# Patient Record
Sex: Male | Born: 1985 | Hispanic: No | Marital: Single | Smoking: Never smoker
Health system: Southern US, Community
[De-identification: ages and names within clinical notes are randomized; demographics above are authoritative.]

---

## 2015-10-10 ENCOUNTER — Encounter (HOSPITAL_BASED_OUTPATIENT_CLINIC_OR_DEPARTMENT_OTHER): Payer: Self-pay | Admitting: Emergency Medicine

## 2015-10-10 ENCOUNTER — Emergency Department (HOSPITAL_BASED_OUTPATIENT_CLINIC_OR_DEPARTMENT_OTHER)
Admission: EM | Admit: 2015-10-10 | Discharge: 2015-10-10 | Disposition: A | Discharge status: Home / Self Care | Payer: Self-pay | Attending: Emergency Medicine | Admitting: Emergency Medicine

## 2015-10-10 ENCOUNTER — Emergency Department (HOSPITAL_BASED_OUTPATIENT_CLINIC_OR_DEPARTMENT_OTHER): Payer: Self-pay

## 2015-10-10 ENCOUNTER — Emergency Department: Payer: Self-pay

## 2015-10-10 DIAGNOSIS — Y998 Other external cause status: Secondary | ICD-10-CM | POA: Insufficient documentation

## 2015-10-10 DIAGNOSIS — M25561 Pain in right knee: Secondary | ICD-10-CM

## 2015-10-10 DIAGNOSIS — S86911A Strain of unspecified muscle(s) and tendon(s) at lower leg level, right leg, initial encounter: Secondary | ICD-10-CM | POA: Insufficient documentation

## 2015-10-10 DIAGNOSIS — X58XXXA Exposure to other specified factors, initial encounter: Secondary | ICD-10-CM | POA: Insufficient documentation

## 2015-10-10 DIAGNOSIS — Y9389 Activity, other specified: Secondary | ICD-10-CM | POA: Insufficient documentation

## 2015-10-10 DIAGNOSIS — Y9289 Other specified places as the place of occurrence of the external cause: Secondary | ICD-10-CM | POA: Insufficient documentation

## 2015-10-10 MED ORDER — IBUPROFEN 800 MG PO TABS
800.0000 mg | ORAL_TABLET | Freq: Once | ORAL | Status: AC
  Administered 2015-10-10: 800 mg via ORAL
  Filled 2015-10-10: qty 1

## 2015-10-10 NOTE — ED Notes (Signed)
C/o right knee pain with bending. No injury. Onset Sunday.

## 2015-10-10 NOTE — ED Provider Notes (Addendum)
CSN: [id number]     Arrival date & time 10/10/15  0732 History   First MD Initiated Contact with Patient 10/10/15 0744     No chief complaint on file.    (Consider location/radiation/quality/duration/timing/severity/associated sxs/prior Treatment) HPI 30 year old male who presents with right knee pain. No significant PMH. Moving furniture 4-5 days ago, and the next day began to noticed right knee pain. Taking ibuprofen for pain control. No fall or trauma. No significant swelling. Has been able to ambulate. Localizes pain to the superior aspect of the knee. No other injuries.   History reviewed. No pertinent past medical history. History reviewed. No pertinent past surgical history. History reviewed. No pertinent family history. Social History  Substance Use Topics  . Smoking status: Never Smoker   . Smokeless tobacco: None  . Alcohol Use: None    Review of Systems  Constitutional: Negative for fever.  Musculoskeletal: Negative for joint swelling.  Skin: Negative for wound.  Hematological: Does not bruise/bleed easily.  All other systems reviewed and are negative.     Allergies  Review of patient's allergies indicates no known allergies.  Home Medications   Prior to Admission medications   Not on File   BP 139/100 mmHg  Pulse 112  Temp(Src) 99.1 F (37.3 C) (Oral)  Resp 26  Ht 6' 2.5" (1.892 m)  Wt 398 lb (180.532 kg)  BMI 50.43 kg/m2  SpO2 99% Physical Exam Physical Exam  Nursing note and vitals reviewed. Constitutional: Well developed, well nourished, non-toxic, and in no acute distress Head: Normocephalic and atraumatic.  Mouth/Throat: Oropharynx is clear and moist.  Neck: Normal range of motion. Neck supple.  Cardiovascular: Normal rate and regular rhythm.  +2 DP pulse bilaterally Pulmonary/Chest: Effort normal Abdominal: Soft. Non-tender  Musculoskeletal: focused exam of right knee, tender to palpation over superior aspect of the left knee, no swelling or  deformities, full flexion and pain with extension Neurological: Alert, no facial droop, fluent speech, in tact sensation to light touch in lower extremities, full strength ankle plantar/dorsiflexion Skin: Skin is warm and dry.  Psychiatric: Cooperative  ED Course  Procedures (including critical care time) Labs Review Labs Reviewed - No data to display  Imaging Review Dg Knee Complete 4 Views Right  10/10/2015  CLINICAL DATA:  Anterior right knee pain beginning 3 days ago. No known injury. EXAM: RIGHT KNEE - COMPLETE 4+ VIEW COMPARISON:  None. FINDINGS: No acute fracture, dislocation, or knee joint effusion is identified. There is minimal articular surface patellar spurring, and a small superior patellar enthesophyte is also noted. 9 mm well-circumscribed oval density in the proximal shaft of the tibia is suggestive of a benign bone island. Medial and lateral compartment joint space widths are preserved. No focal soft tissue abnormality is identified. IMPRESSION: No acute osseous abnormality identified. Superior patellar enthesophyte. Electronically Signed   By: Sebastian Ache M.D.   On: 10/10/2015 08:23   I have personally reviewed and evaluated these images and lab results as part of my medical decision-making.   EKG Interpretation None      MDM   Final diagnoses:  Knee strain, right, initial encounter    30 year old male who presents with right knee pain. Able to ambulate and neurovascularly in tact distally. Suspect strain of knee with heavy lifting. Discussed supportive care instructions. Given sports med follow-up. X-ray without fracture. Sports med referral given. Strict return and follow-up instructions reviewed. He expressed understanding of all discharge instructions and felt comfortable with the plan  of care.     Lavera Guiseana Duo Liu, MD 10/10/15 [id number]  Lavera Guiseana Duo Liu, MD 10/10/15 [phone]

## 2015-10-10 NOTE — Discharge Instructions (Signed)
No fracture seen on X-ray. Keep knee elevated at rest. Ice knee at rest. Take motrin and tylenol as needed for pain control. Return for worsening symptoms, including inability to walk, new numbness/weakness, or any other symptoms concerning to you.  Knee Pain Knee pain is a common problem. It can have many causes. The pain often goes away by following your doctor's home care instructions. Treatment for ongoing pain will depend on the cause of your pain. If your knee pain continues, more tests may be needed to diagnose your condition. Tests may include X-rays or other imaging studies of your knee. HOME CARE  Take medicines only as told by your doctor.  Rest your knee and keep it raised (elevated) while you are resting.  Do not do things that cause pain or make your pain worse.  Avoid activities where both feet leave the ground at the same time, such as running, jumping rope, or doing jumping jacks.  Apply ice to the knee area:  Put ice in a plastic bag.  Place a towel between your skin and the bag.  Leave the ice on for 20 minutes, 2-3 times a day.  Ask your doctor if you should wear an elastic knee support.  Sleep with a pillow under your knee.  Lose weight if you are overweight. Being overweight can make your knee hurt more.  Do not use any tobacco products, including cigarettes, chewing tobacco, or electronic cigarettes. If you need help quitting, ask your doctor. Smoking may slow the healing of any bone and joint problems that you may have. GET HELP IF:  Your knee pain does not stop, it changes, or it gets worse.  You have a fever along with knee pain.  Your knee gives out or locks up.  Your knee becomes more swollen. GET HELP RIGHT AWAY IF:   Your knee feels hot to the touch.  You have chest pain or trouble breathing.   This information is not intended to replace advice given to you by your health care provider. Make sure you discuss any questions you have with your  health care provider.   Document Released: 12/19/2008 Document Revised: 10/13/2014 Document Reviewed: 11/23/2013 Elsevier Interactive Patient Education [employer]2016 Elsevier Inc.

## 2018-04-06 ENCOUNTER — Other Ambulatory Visit: Payer: Self-pay

## 2018-04-06 ENCOUNTER — Encounter (HOSPITAL_COMMUNITY): Payer: Self-pay | Admitting: Emergency Medicine

## 2018-04-06 ENCOUNTER — Emergency Department (HOSPITAL_COMMUNITY): Payer: BLUE CROSS/BLUE SHIELD

## 2018-04-06 ENCOUNTER — Emergency Department (HOSPITAL_COMMUNITY)
Admission: EM | Admit: 2018-04-06 | Discharge: 2018-04-06 | Disposition: A | Discharge status: Home / Self Care | Payer: BLUE CROSS/BLUE SHIELD | Attending: Emergency Medicine | Admitting: Emergency Medicine

## 2018-04-06 DIAGNOSIS — R1031 Right lower quadrant pain: Secondary | ICD-10-CM | POA: Insufficient documentation

## 2018-04-06 DIAGNOSIS — M545 Low back pain, unspecified: Secondary | ICD-10-CM

## 2018-04-06 DIAGNOSIS — M549 Dorsalgia, unspecified: Secondary | ICD-10-CM | POA: Diagnosis present

## 2018-04-06 LAB — BASIC METABOLIC PANEL
Anion gap: 9 (ref 5–15)
BUN: 13 mg/dL (ref 6–20)
CHLORIDE: 106 mmol/L (ref 98–111)
CO2: 26 mmol/L (ref 22–32)
CREATININE: 1.14 mg/dL (ref 0.61–1.24)
Calcium: 9.2 mg/dL (ref 8.9–10.3)
Glucose, Bld: 91 mg/dL (ref 70–99)
POTASSIUM: 5.4 mmol/L — AB (ref 3.5–5.1)
SODIUM: 141 mmol/L (ref 135–145)

## 2018-04-06 LAB — I-STAT CHEM 8, ED
BUN: 11 mg/dL (ref 6–20)
CHLORIDE: 102 mmol/L (ref 98–111)
Calcium, Ion: 1.19 mmol/L (ref 1.15–1.40)
Creatinine, Ser: 1.1 mg/dL (ref 0.61–1.24)
Glucose, Bld: 93 mg/dL (ref 70–99)
HEMATOCRIT: 42 % (ref 39.0–52.0)
Hemoglobin: 14.3 g/dL (ref 13.0–17.0)
Potassium: 4.1 mmol/L (ref 3.5–5.1)
SODIUM: 141 mmol/L (ref 135–145)
TCO2: 25 mmol/L (ref 22–32)

## 2018-04-06 LAB — URINALYSIS, ROUTINE W REFLEX MICROSCOPIC
BILIRUBIN URINE: NEGATIVE
GLUCOSE, UA: NEGATIVE mg/dL
Hgb urine dipstick: NEGATIVE
KETONES UR: NEGATIVE mg/dL
LEUKOCYTES UA: NEGATIVE
Nitrite: NEGATIVE
PH: 6 (ref 5.0–8.0)
PROTEIN: NEGATIVE mg/dL
Specific Gravity, Urine: 1.012 (ref 1.005–1.030)

## 2018-04-06 LAB — CBC
HCT: 43.2 % (ref 39.0–52.0)
HEMOGLOBIN: 14.1 g/dL (ref 13.0–17.0)
MCH: 27.9 pg (ref 26.0–34.0)
MCHC: 32.6 g/dL (ref 30.0–36.0)
MCV: 85.5 fL (ref 78.0–100.0)
PLATELETS: 343 10*3/uL (ref 150–400)
RBC: 5.05 MIL/uL (ref 4.22–5.81)
RDW: 12.8 % (ref 11.5–15.5)
WBC: 8.1 10*3/uL (ref 4.0–10.5)

## 2018-04-06 MED ORDER — KETOROLAC TROMETHAMINE 15 MG/ML IJ SOLN
15.0000 mg | Freq: Once | INTRAMUSCULAR | Status: AC
  Administered 2018-04-06: 15 mg via INTRAVENOUS
  Filled 2018-04-06: qty 1

## 2018-04-06 MED ORDER — NAPROXEN 375 MG PO TABS: 375.0000 mg | ORAL_TABLET | Freq: Two times a day (BID) | ORAL | 0 refills | Status: DC

## 2018-04-06 MED ORDER — METHOCARBAMOL 500 MG PO TABS: 500.0000 mg | ORAL_TABLET | Freq: Three times a day (TID) | ORAL | 0 refills | Status: DC | PRN

## 2018-04-06 NOTE — ED Triage Notes (Signed)
Patient here from home with complaints of right sided flank pain that started today. Denies n/v/d.

## 2018-04-06 NOTE — Discharge Instructions (Signed)
Back Pain:  Your work up in the emergency department was reassuring. Your labs (when repeated) were all normal. Your CT did not show signs of a kidney stone, you do have a small ventral hernia and some fatty liver findings, we do not think these are causing your symptoms. We suspect this is related to a musculoskeletal problem. We are sending you home with medications to help with this.   I have prescribed you an anti-inflammatory medication and a muscle relaxer.   Naproxen is a nonsteroidal anti-inflammatory medication that will help with pain and swelling. Be sure to take this medication as prescribed with food, 1 pill every 12 hours,  It should be taken with food, as it can cause stomach upset, and more seriously, stomach bleeding. Do not take other nonsteroidal anti-inflammatory medications with this such as Advil, Motrin, mobic, diclofenac, or Aleve.   Robaxin is the muscle relaxer I have prescribed, this is meant to help with muscle tightness. Be aware that this medication may make you drowsy therefore the first time you take this it should be at a time you are in an environment where you can rest. Do not drive or operate heavy machinery when taking this medication.   In addition you may also take Tylenol. Tylenol is generally safe, though you should not take more than 8 of the extra strength (500mg ) pills a day.  The application of heat can help soothe the pain.  Maintaining your daily activities, including walking, is encourged, as it will help you get better faster than just staying in bed.  Your pain should get better over the next 2 weeks.  You will need to follow up with  Your primary healthcare provider in 1-2 weeks for reassessment, if you do not have a primary care provider one is provided in your discharge instructions. However if you develop severe or worsening pain, low back pain with fever, numbness, weakness, loss of bowel or bladder control, or inability to walk or urinate, you  should return to the ER immediately.  Please follow up with your doctor this week for a recheck if still having symptoms.

## 2018-04-06 NOTE — ED Provider Notes (Signed)
[facility]-EMERGENCY DEPT Provider Note   CSN: [id number] Arrival date & time: 04/06/18  1646     History   Chief Complaint Chief Complaint  Patient presents with  . Flank Pain    HPI Cody Reyes is a 32 y.o. male without significant past medical hx who presents to the ED with R sided back pain that started 4 days ago. Pain is described as stabbing/cramping/sharp in nature. Pain does not radiate. Current pain is a 5/10 in severity. Pain is worse with certain movements/positions. He has taken diclofenac without relief. He reports associated urinary frequency. Denies dysuria, hematuria, numbness, tingling, weakness, incontinence to bowel/bladder, fever, chills, IV drug use, or hx of cancer. Denies injury or recent heavy lifting.   HPI  History reviewed. No pertinent past medical history.  There are no active problems to display for this patient.   History reviewed. No pertinent surgical history.      Home Medications    Prior to Admission medications   Not on File    Family History No family history on file.  Social History Social History   Tobacco Use  . Smoking status: Never Smoker  . Smokeless tobacco: Never Used  Substance Use Topics  . Alcohol use: Not on file  . Drug use: Not on file     Allergies   Patient has no known allergies.   Review of Systems Review of Systems  Constitutional: Negative for chills and fever.  Respiratory: Negative for shortness of breath.   Cardiovascular: Negative for chest pain.  Gastrointestinal: Negative for abdominal pain, blood in stool, constipation, diarrhea, nausea and vomiting.  Genitourinary: Positive for frequency. Negative for discharge, dysuria, hematuria, scrotal swelling and testicular pain.  Musculoskeletal: Positive for back pain.  Neurological: Negative for weakness and numbness.  All other systems reviewed and are negative.    Physical Exam Updated Vital Signs BP 131/68 (BP  Location: Right Arm)   Pulse 67   Temp 98 F (36.7 C) (Oral)   Resp 18   SpO2 98%   Physical Exam  Constitutional: He appears well-developed and well-nourished. No distress.  HENT:  Head: Normocephalic and atraumatic.  Eyes: Conjunctivae are normal. Right eye exhibits no discharge. Left eye exhibits no discharge.  Cardiovascular: Normal rate and regular rhythm.  No murmur heard. Pulmonary/Chest: Breath sounds normal. No respiratory distress. He has no wheezes. He has no rales.  Abdominal: Soft. He exhibits no distension. There is no tenderness. There is no rebound and no guarding.  Musculoskeletal:  No obvious deformity, appreciable swelling, erythema, ecchymosis, open wounds, or overlying warmth.  Back: No midline tenderness. Mild right lumbar paraspinal muscle tenderness to palpation.   Neurological: He is alert.  Clear speech. Sensation grossly intact to bilateral lower extremities. 5/5 strength with plantar/dorsiflexion bilaterally. 2+ symmetric DTRs. Gait intact.   Skin: Skin is warm and dry. No rash noted.  Psychiatric: He has a normal mood and affect. His behavior is normal.  Nursing note and vitals reviewed.   ED Treatments / Results  Labs Results for orders placed or performed during the hospital encounter of 04/06/18  Urinalysis, Routine w reflex microscopic  Result Value Ref Range   Color, Urine YELLOW YELLOW   APPearance CLEAR CLEAR   Specific Gravity, Urine 1.012 1.005 - 1.030   pH 6.0 5.0 - 8.0   Glucose, UA NEGATIVE NEGATIVE mg/dL   Hgb urine dipstick NEGATIVE NEGATIVE   Bilirubin Urine NEGATIVE NEGATIVE   Ketones, ur NEGATIVE NEGATIVE mg/dL  Protein, ur NEGATIVE NEGATIVE mg/dL   Nitrite NEGATIVE NEGATIVE   Leukocytes, UA NEGATIVE NEGATIVE  CBC  Result Value Ref Range   WBC 8.1 4.0 - 10.5 K/uL   RBC 5.05 4.22 - 5.81 MIL/uL   Hemoglobin 14.1 13.0 - 17.0 g/dL   HCT 16.1 09.6 - 04.5 %   MCV 85.5 78.0 - 100.0 fL   MCH 27.9 26.0 - 34.0 pg   MCHC 32.6 30.0  - 36.0 g/dL   RDW 40.9 81.1 - 91.4 %   Platelets 343 150 - 400 K/uL  Basic metabolic panel  Result Value Ref Range   Sodium 141 135 - 145 mmol/L   Potassium 5.4 (H) 3.5 - 5.1 mmol/L   Chloride 106 98 - 111 mmol/L   CO2 26 22 - 32 mmol/L   Glucose, Bld 91 70 - 99 mg/dL   BUN 13 6 - 20 mg/dL   Creatinine, Ser 7.82 0.61 - 1.24 mg/dL   Calcium 9.2 8.9 - 95.6 mg/dL   GFR calc non Af Amer >60 >60 mL/min   GFR calc Af Amer >60 >60 mL/min   Anion gap 9 5 - 15  I-Stat Chem 8, ED  Result Value Ref Range   Sodium 141 135 - 145 mmol/L   Potassium 4.1 3.5 - 5.1 mmol/L   Chloride 102 98 - 111 mmol/L   BUN 11 6 - 20 mg/dL   Creatinine, Ser 2.13 0.61 - 1.24 mg/dL   Glucose, Bld 93 70 - 99 mg/dL   Calcium, Ion 0.86 5.78 - 1.40 mmol/L   TCO2 25 22 - 32 mmol/L   Hemoglobin 14.3 13.0 - 17.0 g/dL   HCT 46.9 62.9 - 52.8 %    EKG None  Radiology Ct Renal Stone Study  Result Date: 04/06/2018 CLINICAL DATA:  Right flank and back pain for 1 week EXAM: CT ABDOMEN AND PELVIS WITHOUT CONTRAST TECHNIQUE: Multidetector CT imaging of the abdomen and pelvis was performed following the standard protocol without oral or IV contrast. COMPARISON:  None. FINDINGS: Lower chest: Lung bases are clear. Hepatobiliary: There is hepatic steatosis. No focal liver lesions are evident on this noncontrast enhanced study. Gallbladder wall is not appreciably thickened. There is no biliary duct dilatation. Pancreas: There is no evident pancreatic mass or inflammatory focus. Spleen: No splenic lesions are appreciable. Adrenals/Urinary Tract: Adrenals bilaterally appear normal. Kidneys bilaterally show no evident mass or hydronephrosis on either side. There is no renal or ureteral calculus on either side. Urinary bladder is midline with wall thickness within normal limits for degree of distention. Stomach/Bowel: There is no appreciable bowel wall or mesenteric thickening. There is no evident bowel obstruction. No free air or portal  venous air. Vascular/Lymphatic: No abdominal aortic aneurysm. No vascular lesions are evident. No adenopathy is appreciable in the abdomen or pelvis. Reproductive: Prostate and seminal vesicles are normal in size and contour. No evident pelvic mass. Other: Appendix appears normal. No abscess or ascites is evident in the abdomen or pelvis. There is a minimal ventral hernia containing only fat. Musculoskeletal: There are no blastic or lytic bone lesions. There is degenerative change in the lower thoracic region. There is no intramuscular or abdominal wall lesion evident. IMPRESSION: 1.  No evident renal or ureteral calculus.  No hydronephrosis. 2. No evident bowel obstruction. No abscess in the abdomen or pelvis. Appendix appears normal. 3.  Minimal ventral hernia containing only fat. 4.  Hepatic steatosis. Electronically Signed   By: Bretta Bang III M.D.  On: 04/06/2018 18:14    Procedures Procedures (including critical care time)  Medications Ordered in ED Medications  ketorolac (TORADOL) 15 MG/ML injection 15 mg (has no administration in time range)     Initial Impression / Assessment and Plan / ED Course  I have reviewed the triage vital signs and the nursing notes.  Pertinent labs & imaging results that were available during my care of the patient were reviewed by me and considered in my medical decision making (see chart for details).   Patient presents with R sided back pain. Patient nontoxic appearing, in no apparent distress, initial vitals WNL. On exam patient with mild tenderness to R lumbar paraspinal region. He is concerned this is not musculoskeletal in nature, although this is highest on my differential. Given urinary frequency and patient concern, considering nephrolithiasis- discussed risks/benefits of evaluating for this with labs and CT, patient would prefer to proceed with this. Will obtain UA, basic labs, and CT renal study. Patient's CT renal study without evidence of  renal/ureteral calculus. No bowel obstruction. No abscess. Appendix normal. There is a minimal ventral fat containing hernia. Hepatic steatosis noted. No identifiable cause of patient's pain on CT. Urinalysis without signs of infection. Discussed urinary frequency with the patient- he states he is not sexually active, he has no concern for STDs, offered testing to which he declined. His labs are grossly unremarkable, no leukocytosis or anemia, renal fxn WNL. Initial potassium elevated at 5.4 this was rechecked and it is 4.1- feel that initial lab value was inaccurate.   I feel that this is likely muscle strain/spasm. Patient has normal neurologic exam, no midline tenderness to palpation. He is ambulatory in the ED.  No back pain red flags. Considered disc disease, UTI/pyelonephritis, kidney stone, aortic aneurysm/dissection, cauda equina or epidural abscess however these do not fit clinical picture at this time. Will treat with Naproxen and Robaxin, discussed with patient that they are not to drive or operate heavy machinery while taking Robaxin. I discussed treatment plan, need for PCP follow-up, and return precautions with the patient. Provided opportunity for questions, patient confirmed understanding and is in agreement with plan.   Final Clinical Impressions(s) / ED Diagnoses   Final diagnoses:  Acute right-sided low back pain without sciatica    ED Discharge Orders        Ordered    naproxen (NAPROSYN) 375 MG tablet  2 times daily     04/06/18 2110    methocarbamol (ROBAXIN) 500 MG tablet  Every 8 hours PRN     04/06/18 2110       Cherly Anderson, PA-C 04/06/18 2158    Loren Racer, MD 04/08/18 1800

## 2018-04-26 ENCOUNTER — Ambulatory Visit: Payer: BLUE CROSS/BLUE SHIELD | Attending: Physician Assistant | Admitting: Physician Assistant

## 2018-04-26 VITALS — BP 138/78 | HR 82 | Temp 98.2°F | Resp 18 | Ht 74.0 in | Wt >= 6400 oz

## 2018-04-26 DIAGNOSIS — R350 Frequency of micturition: Secondary | ICD-10-CM | POA: Diagnosis not present

## 2018-04-26 DIAGNOSIS — G8929 Other chronic pain: Secondary | ICD-10-CM | POA: Insufficient documentation

## 2018-04-26 DIAGNOSIS — M545 Low back pain, unspecified: Secondary | ICD-10-CM

## 2018-04-26 DIAGNOSIS — Z79899 Other long term (current) drug therapy: Secondary | ICD-10-CM | POA: Insufficient documentation

## 2018-04-26 DIAGNOSIS — M549 Dorsalgia, unspecified: Secondary | ICD-10-CM | POA: Diagnosis present

## 2018-04-26 NOTE — Progress Notes (Signed)
Cody ChangYsel Antonellis  NWG:[id number]  [id number]:[id number]  DOB - Nov 30, 1985  Chief Complaint  Patient presents with  . Follow-up  . Back Pain       Subjective:   Cody Reyes is a 32 y.o. male here today for establishment of care.  He has had some issues with chronic back pain/sciatica being seen by occupational medicine as recently as 2 weeks ago.  He went to the emergency department on 04/06/2018 with right-sided back pain that gotten a little bit worse.  He described it as a stabbing, crampy or sharp-like sensation without radiation.  Seem to be positional.  He was worried about it being in his kidney so went in for further evaluation.  He was having some urinary frequency.  His vital signs were stable.  A urinalysis was negative.  A CT of the renals was unremarkable.  He was treated with Toradol.  He was given prescriptions for anti-inflammatories and muscle relaxers but he was already on these at home.  He also was already on a prednisone taper which I do not believe that the emergency department knew about at the time.  Since his ED visit he is back to work.  He is on light duty but this was previously set into place.  He feels a little bit better overall.  Compliant with his medications when he feels that he needs them.  No new complaints.  ROS: GEN: denies fever or chills, denies change in weight Skin: denies lesions or rashes HEENT: denies headache, earache, epistaxis, sore throat, or neck pain LUNGS: denies SHOB, dyspnea, PND, orthopnea CV: denies CP or palpitations ABD: denies abd pain, N or V EXT: +muscle spasms or swelling; no pain in lower ext, no weakness NEURO: + numbness or tingling, denies sz, stroke or TIA   ALLERGIES: No Known Allergies  PAST MEDICAL HISTORY: History reviewed. No pertinent past medical history.  PAST SURGICAL HISTORY: History reviewed. No pertinent surgical history.  MEDICATIONS AT HOME: Prior to Admission medications   Medication Sig Start Date End  Date Taking? Authorizing Provider  Aspirin-Acetaminophen (GOODYS BODY PAIN PO) Take 1 Package by mouth daily as needed (pain).   Yes [provider]  diclofenac (VOLTAREN) 75 MG EC tablet Take 75 mg by mouth 2 (two) times daily. 03/30/18  Yes [provider]  methocarbamol (ROBAXIN) 500 MG tablet Take 1 tablet (500 mg total) by mouth every 8 (eight) hours as needed for muscle spasms. 04/06/18  Yes Petrucelli, Samantha R, PA-C  naproxen (NAPROSYN) 375 MG tablet Take 1 tablet (375 mg total) by mouth 2 (two) times daily. 04/06/18  Yes Petrucelli, Samantha R, PA-C  tiZANidine (ZANAFLEX) 4 MG tablet Take 4 mg by mouth daily as needed for muscle spasms.  03/30/18  Yes [provider]    Family hx-noncontributory  Social-unmarried, no children, [place], works on [employer]  Objective:   Vitals:   04/26/18 1043  BP: 138/78  Pulse: 82  Resp: 18  Temp: 98.2 F (36.8 C)  TempSrc: Oral  SpO2: 97%  Weight: (!) 403 lb (182.8 kg)  Height: 6\' 2"  (1.88 m)    Exam General appearance : Awake, alert, not in any distress. Speech Clear. Not toxic looking Chest:Good air entry bilaterally, no added sounds  CVS: S1 S2 regular, no murmurs.  Extremities: B/L Lower Ext shows no edema, both legs are warm to touch Neurology: Awake alert, and oriented X 3, CN II-XII intact, Non focal Skin:No Rash Wounds:N/A   Assessment & Plan  1. Acute on Chronic Right Sided LBP without Sciatica  -cont NSAIDS, Steroids and muscle relaxers  -weight loss encouraged  -cont with occupational med   Return in about 6 weeks (around 06/07/2018).  The patient was given clear instructions to go to ER or return to medical center if symptoms don't improve, worsen or new problems develop. The patient verbalized understanding. The patient was told to call to get lab results if they haven't heard anything in the next week.   Total time spent with patient was 33 min. Greater than 50 % of this visit was  spent face to face counseling and coordinating care regarding risk factor modification, compliance importance and encouragement, education related to back pain and meds.  This note has been created with Education officer, environmental. Any transcriptional errors are unintentional.    Scot Jun, PA-C [facility] [place], [place] [phone]   04/26/2018, 11:01 AM

## 2018-06-01 ENCOUNTER — Ambulatory Visit: Payer: BLUE CROSS/BLUE SHIELD | Admitting: Family Medicine

## 2018-07-28 ENCOUNTER — Ambulatory Visit: Payer: BLUE CROSS/BLUE SHIELD | Attending: Family Medicine | Admitting: Family Medicine

## 2018-07-28 ENCOUNTER — Encounter: Payer: Self-pay | Admitting: Family Medicine

## 2018-07-28 DIAGNOSIS — Z23 Encounter for immunization: Secondary | ICD-10-CM

## 2018-07-28 DIAGNOSIS — M25571 Pain in right ankle and joints of right foot: Secondary | ICD-10-CM

## 2018-07-28 DIAGNOSIS — M25579 Pain in unspecified ankle and joints of unspecified foot: Secondary | ICD-10-CM | POA: Insufficient documentation

## 2018-07-28 DIAGNOSIS — G8929 Other chronic pain: Secondary | ICD-10-CM

## 2018-07-28 NOTE — Progress Notes (Signed)
Subjective:  Patient ID: Cody Reyes, male    DOB: 1986/01/03  Age: 32 y.o. MRN: [id number]  CC: Ankle Pain   HPI Cody Reyes is a 32 year old male who is less than at the clinic 3 months ago for low back pain which he states has resolved at this time but he now has pain in his right ankle.  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 He was seen by [facility] 2 days ago and was prescribed diclofenac and placed in the right lower extremity boot. He has an upcoming appointment for placement of a brace. He currently ambulates with the aid of crutches. He is willing to receive the flu shot today.   History reviewed. No pertinent past medical history.  History reviewed. No pertinent surgical history.  No Known Allergies '   Outpatient Medications Prior to Visit  Medication Sig Dispense Refill  . diclofenac (VOLTAREN) 75 MG EC tablet Take 75 mg by mouth 2 (two) times daily.  0  . Aspirin-Acetaminophen (GOODYS BODY PAIN PO) Take 1 Package by mouth daily as needed (pain).    . predniSONE (STERAPRED UNI-PAK 21 TAB) 10 MG (21) TBPK tablet Take 10 mg by mouth daily.    Marland Kitchen tiZANidine (ZANAFLEX) 4 MG tablet Take 4 mg by mouth daily as needed for muscle spasms.   1   No facility-administered medications prior to visit.     ROS Review of Systems  Constitutional: Negative for activity change and appetite change.  HENT: Negative for sinus pressure and sore throat.   Eyes: Negative for visual disturbance.  Respiratory: Negative for cough, chest tightness and shortness of breath.   Cardiovascular: Negative for chest pain and leg swelling.  Gastrointestinal: Negative for abdominal distention, abdominal pain, constipation and diarrhea.  Endocrine: Negative.   Genitourinary: Negative for dysuria.  Musculoskeletal:       See hpi  Skin: Negative for  rash.  Allergic/Immunologic: Negative.   Neurological: Negative for weakness, light-headedness and numbness.  Psychiatric/Behavioral: Negative for dysphoric mood and suicidal ideas.    Objective:  BP 131/76   Pulse 82   Temp 97.8 F (36.6 C) (Oral)   Ht 6\' 2"  (1.88 m)   Wt (!) 390 lb (176.9 kg)   SpO2 97%   BMI 50.07 kg/m   BP/Weight 07/28/2018 04/26/2018 04/06/2018  Systolic BP 131 138 139  Diastolic BP 76 78 79  Wt. (Lbs) 390 403 396  BMI 50.07 51.74 50.84      Physical Exam  Constitutional: He is oriented to person, place, and time. He appears well-developed and well-nourished.  Cardiovascular: Normal rate, normal heart sounds and intact distal pulses.  No murmur heard. Pulmonary/Chest: Effort normal and breath sounds normal. He has no wheezes. He has no rales. He exhibits no tenderness.  Abdominal: Soft. Bowel sounds are normal. He exhibits no distension and no mass. There is no tenderness.  Musculoskeletal:  Ambulates with the aid of crutches Right ankle in a boot  Neurological: He is alert and oriented to person, place, and time.  Skin: Skin is warm and dry.  Psychiatric: He has a normal mood and affect.     Assessment & Plan:   1. Chronic pain of right ankle Uncontrolled Continue diclofenac Scheduled for right AFO brace placement Keep appointment with orthopedics   No orders of the defined types were placed in this encounter.  Follow-up: Return if symptoms worsen or fail to improve.   Hoy Register MD

## 2018-07-28 NOTE — Patient Instructions (Signed)
Ankle Pain Many things can cause ankle pain, including an injury to the area and overuse of the ankle.The ankle joint holds your body weight and allows you to move around. Ankle pain can occur on either side or the back of one ankle or both ankles. Ankle pain may be sharp and burning or dull and aching. There may be tenderness, stiffness, redness, or warmth around the ankle. Follow these instructions at home: Activity  Rest your ankle as told by your health care provider. Avoid any activities that cause ankle pain.  Do exercises as told by your health care provider.  Ask your health care provider if you can drive. Using a brace, a bandage, or crutches  If you were given a brace: ? Wear it as told by your health care provider. ? Remove it when you take a bath or a shower. ? Try not to move your ankle very much, but wiggle your toes from time to time. This helps to prevent swelling.  If you were given an elastic bandage: ? Remove it when you take a bath or a shower. ? Try not to move your ankle very much, but wiggle your toes from time to time. This helps to prevent swelling. ? Adjust the bandage to make it more comfortable if it feels too tight. ? Loosen the bandage if you have numbness or tingling in your foot or if your foot turns cold and blue.  If you have crutches, use them as told by your health care provider. Continue to use them until you can walk without feeling pain in your ankle. Managing pain, stiffness, and swelling  Raise (elevate) your ankle above the level of your heart while you are sitting or lying down.  If directed, apply ice to the area: ? Put ice in a plastic bag. ? Place a towel between your skin and the bag. ? Leave the ice on for 20 minutes, 2-3 times per day. General instructions  Keep all follow-up visits as told by your health care provider. This is important.  Record this information that may be helpful for you and your health care provider: ? How  often you have ankle pain. ? Where the pain is located. ? What the pain feels like.  Take over-the-counter and prescription medicines only as told by your health care provider. Contact a health care provider if:  Your pain gets worse.  Your pain is not relieved with medicines.  You have a fever or chills.  You are having more trouble with walking.  You have new symptoms. Get help right away if:  Your foot, leg, toes, or ankle tingles or becomes numb.  Your foot, leg, toes, or ankle becomes swollen.  Your foot, leg, toes, or ankle turns pale or blue. This information is not intended to replace advice given to you by your health care provider. Make sure you discuss any questions you have with your health care provider. Document Released: 03/12/2010 Document Revised: 05/23/2016 Document Reviewed: 04/24/2015 Elsevier Interactive Patient Education  2018 [employer]  

## 2019-08-11 IMAGING — CT CT RENAL STONE PROTOCOL
2 of 4 series · 17 of 46 positions shown, 19 images · non-contrast
Comparison: None.

CLINICAL DATA: Right flank and back pain for 1 week

EXAM:
CT ABDOMEN AND PELVIS WITHOUT CONTRAST
TECHNIQUE: Multidetector CT imaging of the abdomen and pelvis was performed
following the standard protocol without oral or IV contrast.

[Series 2: axial st · axial · 0.98mm/px · z∈[+892,+1327]mm · 14 of 101 slices shown, 16 images]
[im 7/101  soft-tissue]
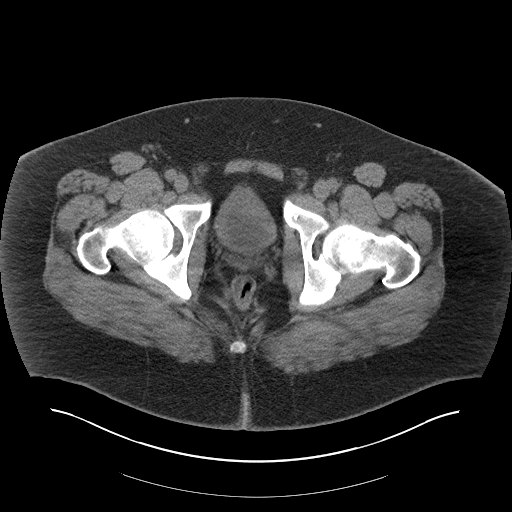
[im 7/101  bone]
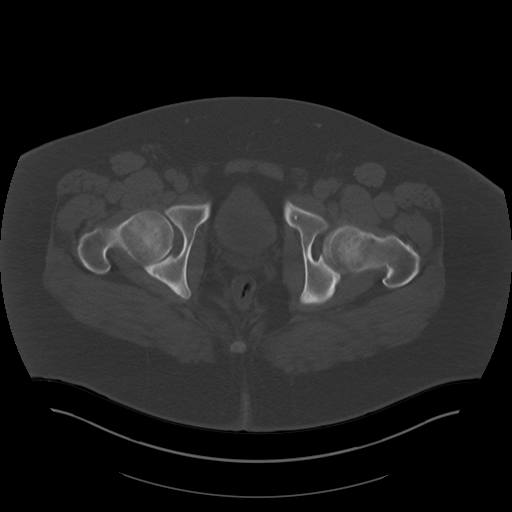
[im 13/101  soft-tissue]
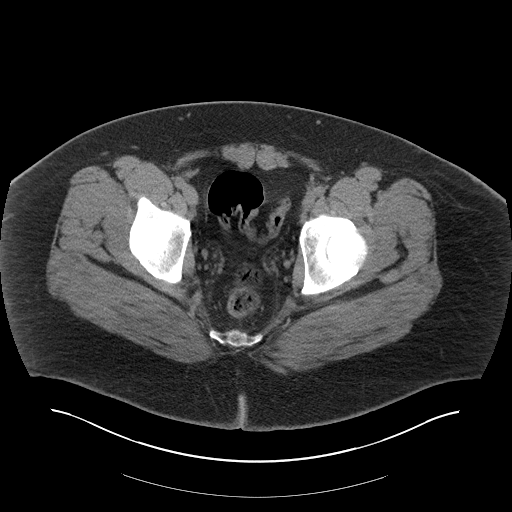
[im 19/101  soft-tissue]
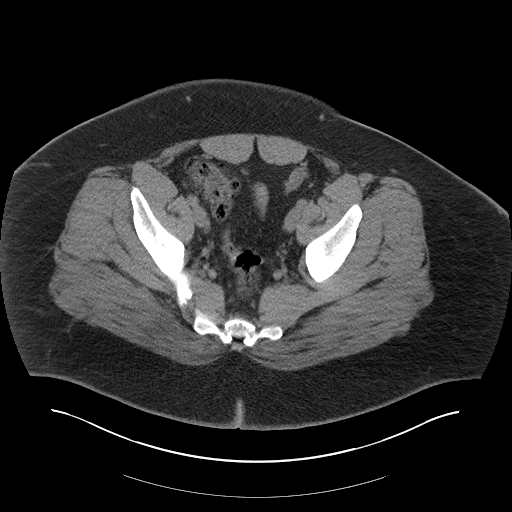
[im 26/101  soft-tissue]
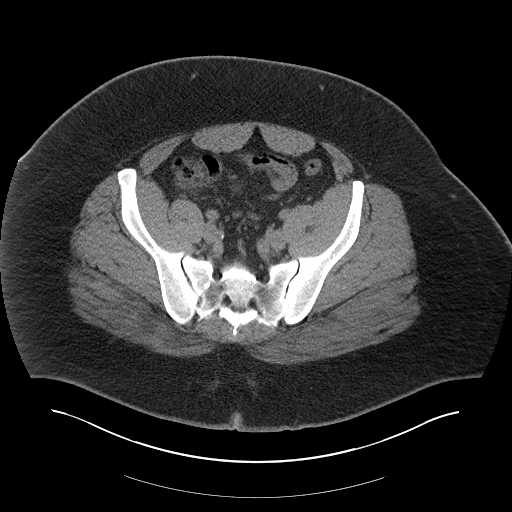
[im 32/101  soft-tissue]
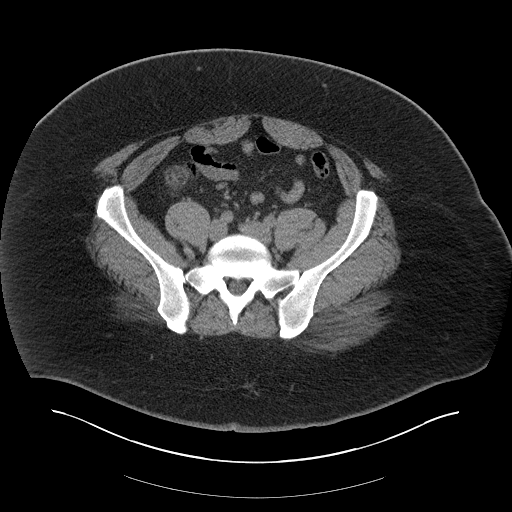
[im 38/101  soft-tissue]
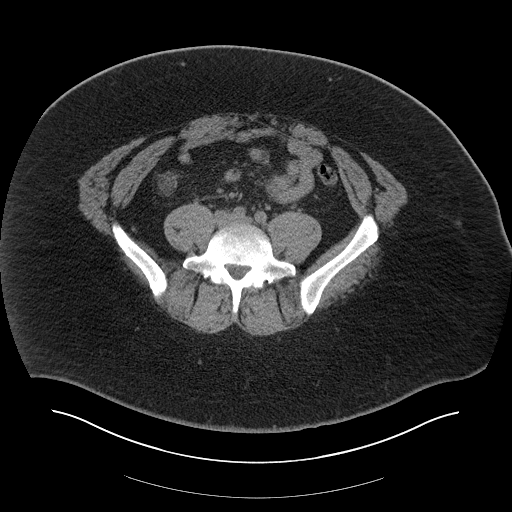
[im 44/101  soft-tissue]
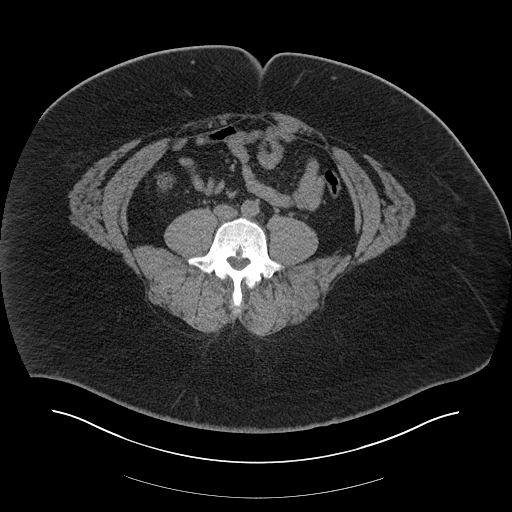
[im 57/101  soft-tissue]
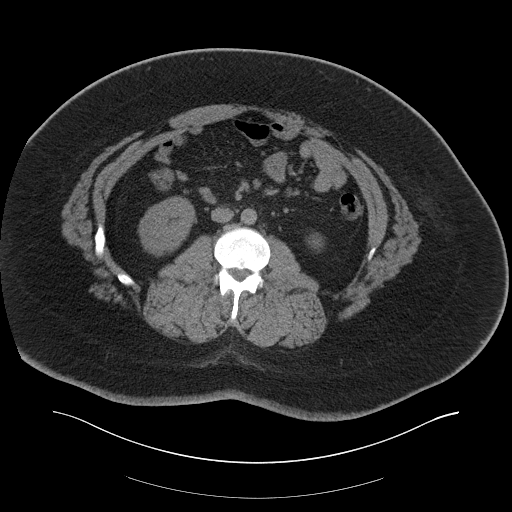
[im 63/101  soft-tissue]
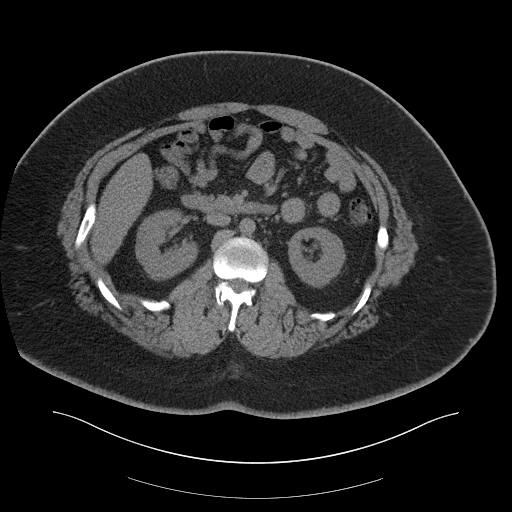
[im 63/101  bone]
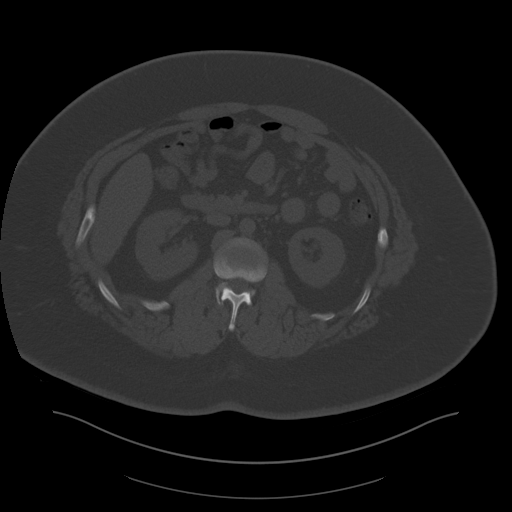
[im 69/101  soft-tissue]
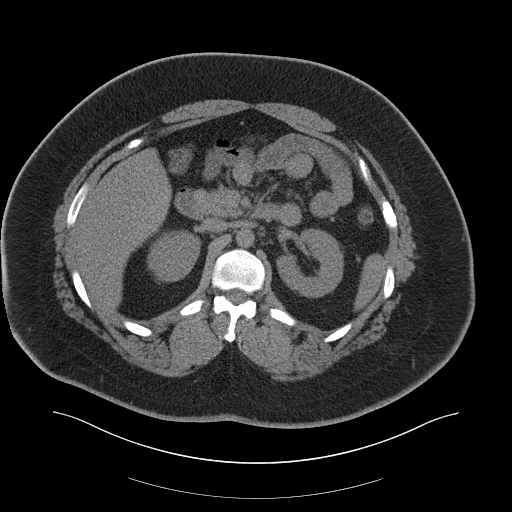
[im 76/101  soft-tissue]
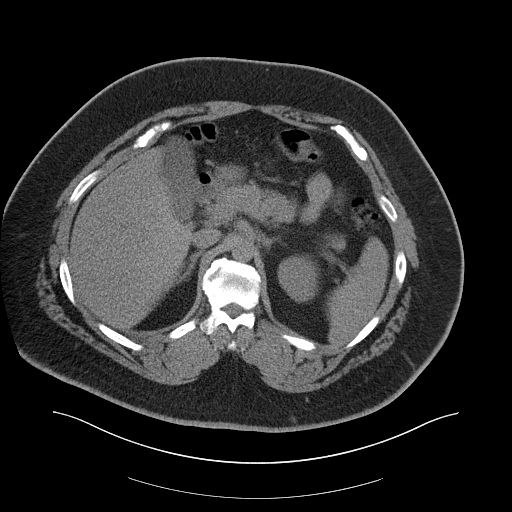
[im 82/101  soft-tissue]
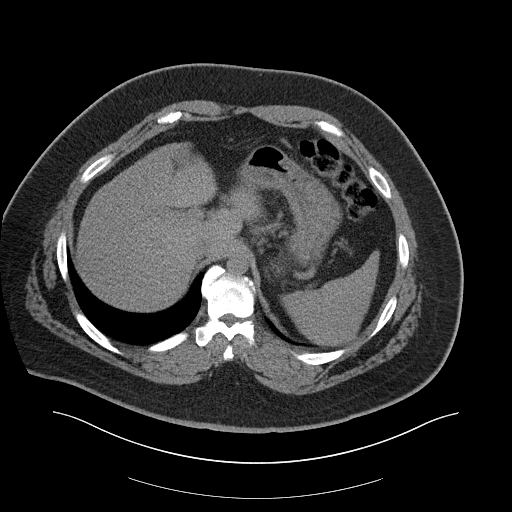
[im 88/101  soft-tissue]
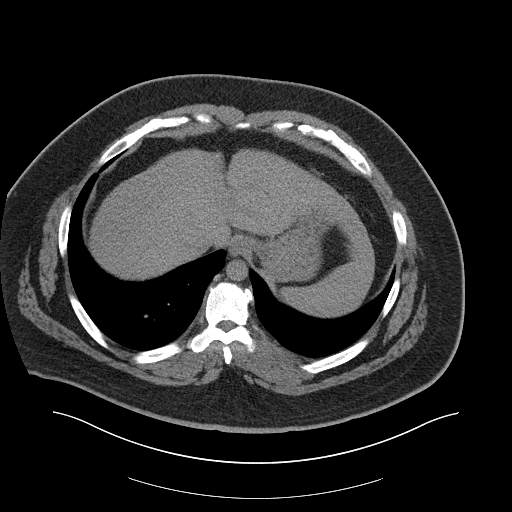
[im 94/101  soft-tissue]
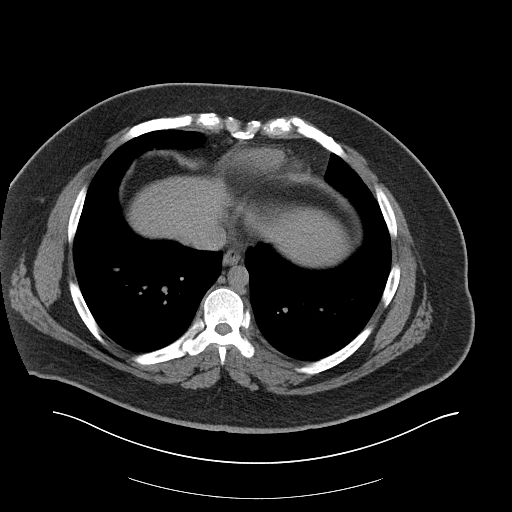

[Series 5: coronal · coronal · 1.01mm/px · 3 of 199 slices shown]
[im 67/199  soft-tissue]
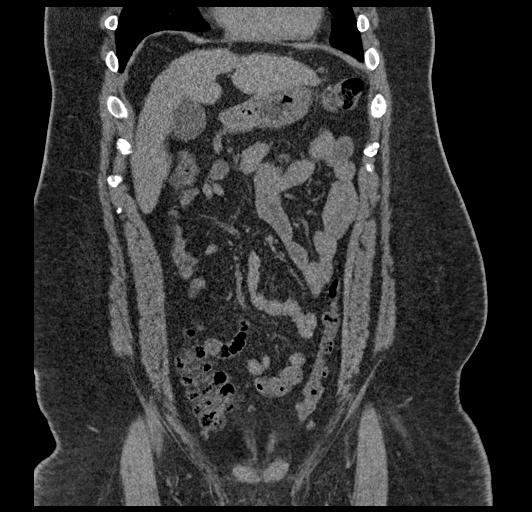
[im 89/199  soft-tissue]
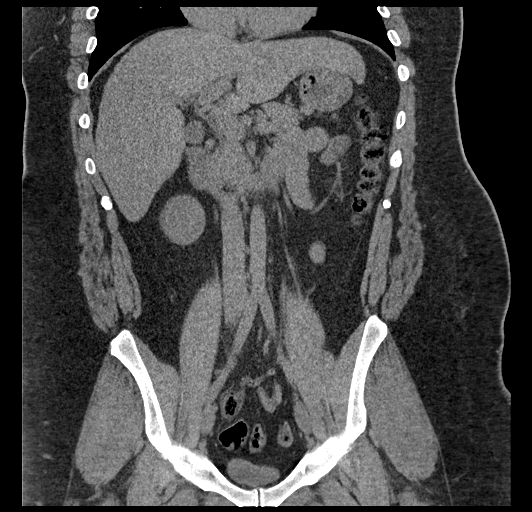
[im 111/199  soft-tissue]
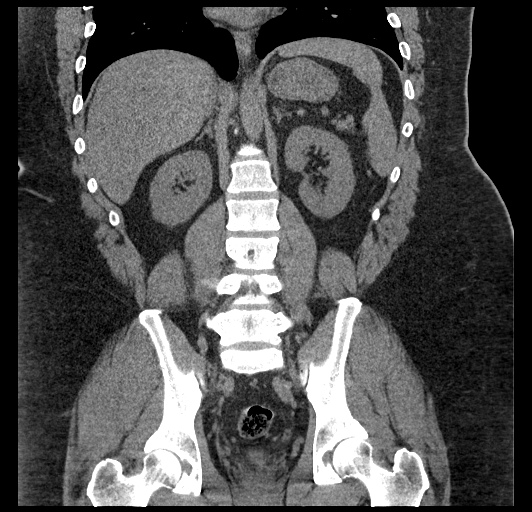

[17 of 46 positions shown; findings below may reference images not displayed]

FINDINGS: Lower chest: Lung bases are clear.

Hepatobiliary: There is hepatic steatosis. No focal liver lesions
are evident on this noncontrast enhanced study. Gallbladder wall is
not appreciably thickened. There is no biliary duct dilatation.

Pancreas: There is no evident pancreatic mass or inflammatory focus.

Spleen: No splenic lesions are appreciable.

Adrenals/Urinary Tract: Adrenals bilaterally appear normal. Kidneys
bilaterally show no evident mass or hydronephrosis on either side.
There is no renal or ureteral calculus on either side. Urinary
bladder is midline with wall thickness within normal limits for
degree of distention.

Stomach/Bowel: There is no appreciable bowel wall or mesenteric
thickening. There is no evident bowel obstruction. No free air or
portal venous air.

Vascular/Lymphatic: No abdominal aortic aneurysm. No vascular
lesions are evident. No adenopathy is appreciable in the abdomen or
pelvis.

Reproductive: Prostate and seminal vesicles are normal in size and
contour. No evident pelvic mass.

Other: Appendix appears normal. No abscess or ascites is evident in
the abdomen or pelvis. There is a minimal ventral hernia containing
only fat.

Musculoskeletal: There are no blastic or lytic bone lesions. There
is degenerative change in the lower thoracic region. There is no
intramuscular or abdominal wall lesion evident.
IMPRESSION: 1.  No evident renal or ureteral calculus.  No hydronephrosis.

2. No evident bowel obstruction. No abscess in the abdomen or
pelvis. Appendix appears normal.

3.  Minimal ventral hernia containing only fat.

4.  Hepatic steatosis.
